# Patient Record
Sex: Male | Born: 2000 | Race: White | Hispanic: No | Marital: Single
Health system: Southern US, Community
[De-identification: ages and names within clinical notes are randomized; demographics above are authoritative.]

---
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
## 2021-05-03 IMAGING — DX DG CHEST 1V PORT
1 series · 1 of 1 positions shown · non-contrast
Comparison: None.

CLINICAL DATA: Tachycardia.  A1K14-4F positive

EXAM:
PORTABLE CHEST 1 VIEW

[chest ap]
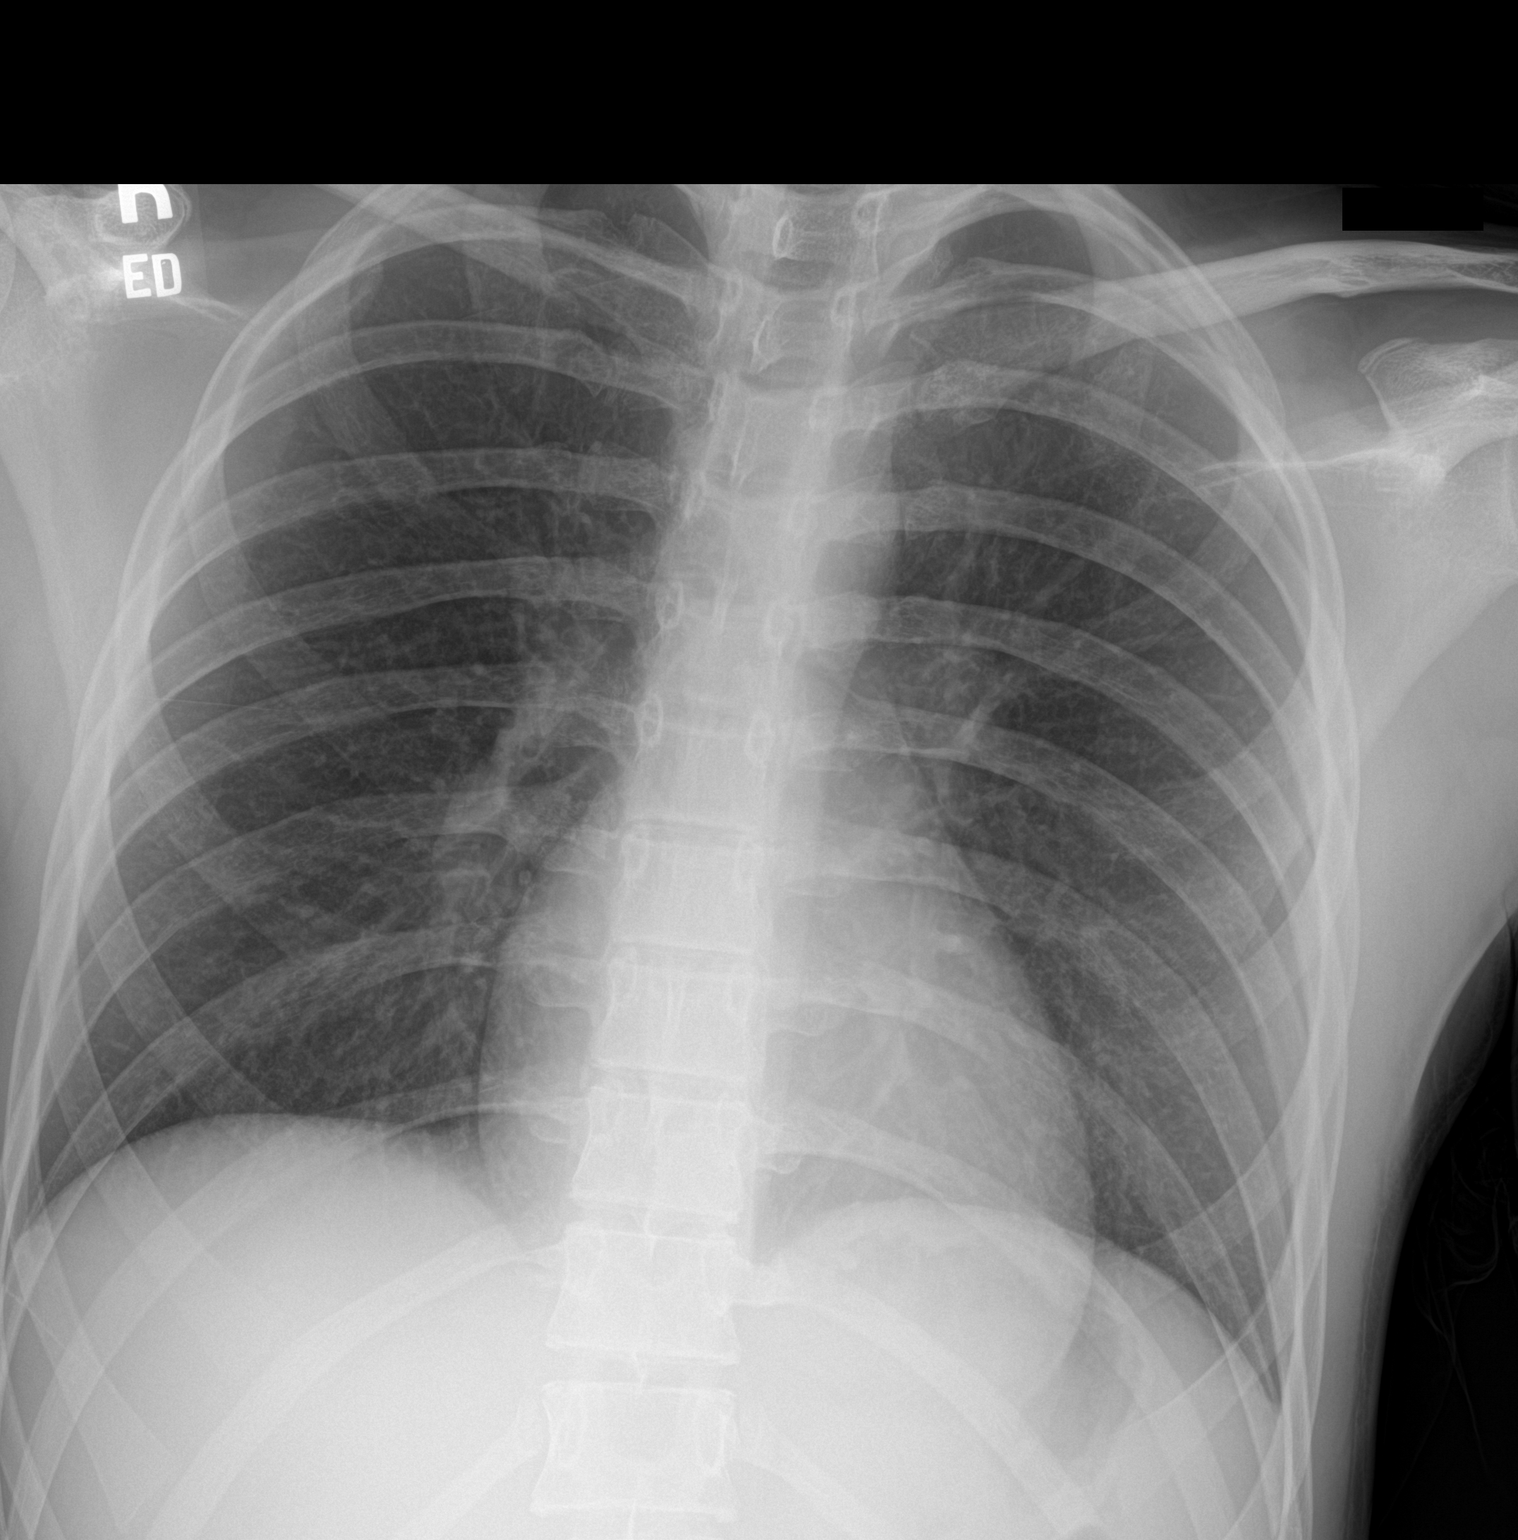

[1 of 1 positions shown; findings below may reference images not displayed]

FINDINGS: Lungs are clear. Heart size and pulmonary vascularity are normal. No
adenopathy. No bone lesions.
IMPRESSION: No edema or consolidation. Cardiac silhouette normal. No adenopathy.
